# Patient Record
Sex: Male | Born: 1995 | Race: White | Hispanic: No | Marital: Single | Smoking: Never smoker
Health system: Southern US, Community
[De-identification: ages and names within clinical notes are randomized; demographics above are authoritative.]

---
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
## 2018-10-14 ENCOUNTER — Emergency Department: Payer: BLUE CROSS/BLUE SHIELD

## 2018-10-14 ENCOUNTER — Emergency Department
Admission: EM | Admit: 2018-10-14 | Discharge: 2018-10-14 | Disposition: A | Discharge status: Home / Self Care | Payer: BLUE CROSS/BLUE SHIELD | Attending: Emergency Medicine | Admitting: Emergency Medicine

## 2018-10-14 ENCOUNTER — Other Ambulatory Visit: Payer: Self-pay

## 2018-10-14 DIAGNOSIS — K529 Noninfective gastroenteritis and colitis, unspecified: Secondary | ICD-10-CM

## 2018-10-14 DIAGNOSIS — R112 Nausea with vomiting, unspecified: Secondary | ICD-10-CM

## 2018-10-14 DIAGNOSIS — R1031 Right lower quadrant pain: Secondary | ICD-10-CM | POA: Diagnosis present

## 2018-10-14 LAB — CBC
HCT: 43.4 % (ref 39.0–52.0)
Hemoglobin: 15.5 g/dL (ref 13.0–17.0)
MCH: 28.8 pg (ref 26.0–34.0)
MCHC: 35.7 g/dL (ref 30.0–36.0)
MCV: 80.5 fL (ref 80.0–100.0)
Platelets: 263 10*3/uL (ref 150–400)
RBC: 5.39 MIL/uL (ref 4.22–5.81)
RDW: 12.4 % (ref 11.5–15.5)
WBC: 7 10*3/uL (ref 4.0–10.5)
nRBC: 0 % (ref 0.0–0.2)

## 2018-10-14 LAB — COMPREHENSIVE METABOLIC PANEL
ALT: 15 U/L (ref 0–44)
AST: 18 U/L (ref 15–41)
Albumin: 4.9 g/dL (ref 3.5–5.0)
Alkaline Phosphatase: 62 U/L (ref 38–126)
Anion gap: 8 (ref 5–15)
BUN: 11 mg/dL (ref 6–20)
CO2: 27 mmol/L (ref 22–32)
Calcium: 9 mg/dL (ref 8.9–10.3)
Chloride: 102 mmol/L (ref 98–111)
Creatinine, Ser: 0.77 mg/dL (ref 0.61–1.24)
GFR calc Af Amer: >60 mL/min (ref >60)
GFR calc non Af Amer: >60 mL/min (ref >60)
Glucose, Bld: 102 mg/dL — ABNORMAL HIGH (ref 70–99)
Potassium: 3.8 mmol/L (ref 3.5–5.1)
Sodium: 137 mmol/L (ref 135–145)
Total Bilirubin: 1.3 mg/dL — ABNORMAL HIGH (ref 0.3–1.2)
Total Protein: 7.5 g/dL (ref 6.5–8.1)

## 2018-10-14 LAB — URINALYSIS, COMPLETE (UACMP) WITH MICROSCOPIC
Bacteria, UA: NONE SEEN
Bilirubin Urine: NEGATIVE
Glucose, UA: NEGATIVE mg/dL
Hgb urine dipstick: NEGATIVE
Ketones, ur: 5 mg/dL — AB
Leukocytes,Ua: NEGATIVE
Nitrite: NEGATIVE
Protein, ur: NEGATIVE mg/dL
Specific Gravity, Urine: 1.002 — ABNORMAL LOW (ref 1.005–1.030)
Squamous Epithelial / HPF: NONE SEEN (ref 0–5)
WBC, UA: NONE SEEN WBC/hpf (ref 0–5)
pH: 7 (ref 5.0–8.0)

## 2018-10-14 LAB — LIPASE, BLOOD: Lipase: 23 U/L (ref 11–51)

## 2018-10-14 MED ORDER — ONDANSETRON HCL 4 MG/2ML IJ SOLN
4.0000 mg | Freq: Once | INTRAMUSCULAR | Status: AC
  Administered 2018-10-14: 03:00:00 4 mg via INTRAVENOUS
  Filled 2018-10-14: qty 2

## 2018-10-14 MED ORDER — SODIUM CHLORIDE 0.9 % IV BOLUS
1000.0000 mL | Freq: Once | INTRAVENOUS | Status: AC
  Administered 2018-10-14: 1000 mL via INTRAVENOUS

## 2018-10-14 MED ORDER — IOHEXOL 300 MG/ML  SOLN
100.0000 mL | Freq: Once | INTRAMUSCULAR | Status: AC | PRN
  Administered 2018-10-14: 05:00:00 100 mL via INTRAVENOUS

## 2018-10-14 MED ORDER — KETOROLAC TROMETHAMINE 30 MG/ML IJ SOLN
15.0000 mg | Freq: Once | INTRAMUSCULAR | Status: AC
  Administered 2018-10-14: 15 mg via INTRAVENOUS
  Filled 2018-10-14: qty 1

## 2018-10-14 MED ORDER — ONDANSETRON 4 MG PO TBDP: 4.0000 mg | ORAL_TABLET | Freq: Three times a day (TID) | ORAL | 0 refills | Status: DC | PRN

## 2018-10-14 NOTE — ED Triage Notes (Signed)
Pt arrives to ED via POV from home with c/o RLQ abdominal pain x1 week with occasional radiation into the RUQ. Pt reports (+) vomiting and diarrhea. Pt reports 2 episodes of emesis in the last 24 hrs; 3-4 episodes of diarrhea. Pt states that the pain increases after eating, but decreases after "drinking lots of water". No c/o SHOB, no CP or fever. No previous abdominal surgeries. Pt is A&O, in NAD; RR even, regular, and unlabored.

## 2018-10-14 NOTE — Discharge Instructions (Addendum)

## 2018-10-14 NOTE — ED Provider Notes (Signed)
[facility] Emergency Department Provider Note  ____________________________________________  Time seen: Approximately 3:15 AM  I have reviewed the triage vital signs and the nursing notes.   HISTORY  Chief Complaint Abdominal Pain   HPI Peter Austin is a 23 y.o. male no significant past medical history who presents for evaluation of abdominal pain.  Patient is complaining of right lower quadrant sharp abdominal pain x1 week associated with vomiting and diarrhea.  The pain is sharp and intermittent.  Vomiting started today.  Had 2 episodes of nonbloody nonbilious emesis.  Has been having 2 daily episodes of watery diarrhea.  No fever or chills.  No known sick contact exposures.  No dysuria or hematuria, no testicular pain or swelling.  No cough, chest pain or shortness of breath.  The pain is currently moderate in intensity.   PMH None - reviewed  Allergies Patient has no known allergies.  No family history on file.  Social History Social History   Tobacco Use   Smoking status: Never Smoker   Smokeless tobacco: Never Used  Substance Use Topics   Alcohol use: Not on file   Drug use: Not on file    Review of Systems  Constitutional: Negative for fever. Eyes: Negative for visual changes. ENT: Negative for sore throat. Neck: No neck pain  Cardiovascular: Negative for chest pain. Respiratory: Negative for shortness of breath. Gastrointestinal: + abdominal pain, vomiting and diarrhea. Genitourinary: Negative for dysuria. Musculoskeletal: Negative for back pain. Skin: Negative for rash. Neurological: Negative for headaches, weakness or numbness. Psych: No SI or HI  ____________________________________________   PHYSICAL EXAM:  VITAL SIGNS: ED Triage Vitals  Enc Vitals Group     BP 10/14/18 0126 121/85     Pulse Rate 10/14/18 0126 93     Resp 10/14/18 0126 17     Temp 10/14/18 0126 98.7 F (37.1 C)     Temp Source 10/14/18 0126  Oral     SpO2 10/14/18 0126 100 %     Weight 10/14/18 0127 165 lb (74.8 kg)     Height 10/14/18 0127 5\' 7"  (1.702 m)     Head Circumference --      Peak Flow --      Pain Score 10/14/18 0126 3     Pain Loc --      Pain Edu? --      Excl. in GC? --     Constitutional: Alert and oriented. Well appearing and in no apparent distress. HEENT:      Head: Normocephalic and atraumatic.         Eyes: Conjunctivae are normal. Sclera is non-icteric.       Mouth/Throat: Mucous membranes are moist.       Neck: Supple with no signs of meningismus. Cardiovascular: Regular rate and rhythm. No murmurs, gallops, or rubs. 2+ symmetrical distal pulses are present in all extremities. No JVD. Respiratory: Normal respiratory effort. Lungs are clear to auscultation bilaterally. No wheezes, crackles, or rhonchi.  Gastrointestinal: Soft, tender to palpation in the RLQ, and non distended with positive bowel sounds. No rebound or guarding. Genitourinary: No CVA tenderness. No testicular pain or swelling Musculoskeletal: Nontender with normal range of motion in all extremities. No edema, cyanosis, or erythema of extremities. Neurologic: Normal speech and language. Face is symmetric. Moving all extremities. No gross focal neurologic deficits are appreciated. Skin: Skin is warm, dry and intact. No rash noted. Psychiatric: Mood and affect are normal. Speech and behavior are normal.  ____________________________________________   LABS (all labs ordered are listed, but only abnormal results are displayed)  Labs Reviewed  COMPREHENSIVE METABOLIC PANEL - Abnormal; Notable for the following components:      Result Value   Glucose, Bld 102 (*)    Total Bilirubin 1.3 (*)    All other components within normal limits  URINALYSIS, COMPLETE (UACMP) WITH MICROSCOPIC - Abnormal; Notable for the following components:   Color, Urine STRAW (*)    APPearance CLEAR (*)    Specific Gravity, Urine 1.002 (*)    Ketones, ur 5  (*)    All other components within normal limits  LIPASE, BLOOD  CBC   ____________________________________________  EKG  none  ____________________________________________  RADIOLOGY  I have personally reviewed the images performed during this visit and I agree with the Radiologist's read.   Interpretation by Radiologist:  Dg Abdomen 1 View  Result Date: 10/14/2018 CLINICAL DATA:  Right abdominal pain, vomiting, diarrhea EXAM: ABDOMEN - 1 VIEW COMPARISON:  None. FINDINGS: Nonobstructive bowel gas pattern. Suspected calcified pelvic phleboliths. Visualized osseous structures are within normal limits. IMPRESSION: Unremarkable abdominal radiograph. Electronically Signed   By: Charline BillsSriyesh  Krishnan M.D.   On: 10/14/2018 03:35   Ct Abdomen Pelvis W Contrast  Result Date: 10/14/2018 CLINICAL DATA:  Unspecified abdominal pain. Right lower quadrant pain for 1 week. Emesis and diarrhea EXAM: CT ABDOMEN AND PELVIS WITH CONTRAST TECHNIQUE: Multidetector CT imaging of the abdomen and pelvis was performed using the standard protocol following bolus administration of intravenous contrast. CONTRAST:  100mL OMNIPAQUE IOHEXOL 300 MG/ML  SOLN COMPARISON:  None. FINDINGS: Lower chest:  No contributory findings. Hepatobiliary: No focal liver abnormality.No evidence of biliary obstruction or stone. Pancreas: Unremarkable. Spleen: Unremarkable. Adrenals/Urinary Tract: Negative adrenals. No hydronephrosis or stone. Unremarkable bladder. Stomach/Bowel:  No obstruction. No appendicitis. Vascular/Lymphatic: No acute vascular abnormality. No mass or adenopathy. Reproductive:No pathologic findings. Other: Trace pelvic fluid, unexpected for demographics, from uncertain source. Musculoskeletal: No acute abnormalities. IMPRESSION: 1. Negative for appendicitis. 2. Trace pelvic fluid which is unexpected but nonspecific as an underlying source is not seen. Electronically Signed   By: Marnee SpringJonathon  Watts M.D.   On: 10/14/2018 05:34        ____________________________________________   PROCEDURES  Procedure(s) performed: None Procedures Critical Care performed:  None ____________________________________________   INITIAL IMPRESSION / ASSESSMENT AND PLAN / ED COURSE   23 y.o. male no significant past medical history who presents for evaluation of abdominal pain, vomiting, diarrhea x 7 days.  Patient is extremely well-appearing and in no distress, has normal vital signs, abdomen is soft with tenderness to palpation the right lower quadrant.  GU exam is normal.  Labs showing no leukocytosis, UA showing very small amount of ketones, CMP is within normal limits.  Differential diagnosis including viral gastroenteritis versus gastritis versus appendicitis versus epiploic appendagitis versus mesenteric adenitis.  At this time with 7 days of pain, no fever, no leukocytosis this is less likely to be appendicitis however obviously still in the differential diagnosis with tenderness to palpation on the right lower quadrant. Discussed with patient and father, getting CT versus waiting 12 hours with re-evaluation by PCP today. Patient ultimately opted for CT which is pending   _________________________ 5:49 AM on 10/14/2018 -----------------------------------------  CT negative for appendicitis.  There is a concerning with a viral gastroenteritis.  Will discharge home with Zofran, increase oral hydration, follow-up with PCP.  Discussed my standard return precautions.  As part of my medical decision making, I reviewed the  following data within the [place] notes reviewed and incorporated, Labs reviewed , Old chart reviewed, Radiograph reviewed , Notes from prior ED visits and [place] Controlled Substance Database   Patient was evaluated in Emergency Department today for the symptoms described in the history of present illness. Patient was evaluated in the context of the global COVID-19 pandemic, which  necessitated consideration that the patient might be at risk for infection with the SARS-CoV-2 virus that causes COVID-19. Institutional protocols and algorithms that pertain to the evaluation of patients at risk for COVID-19 are in a state of rapid change based on information released by regulatory bodies including the CDC and federal and state organizations. These policies and algorithms were followed during the patient's care in the ED.   ____________________________________________   FINAL CLINICAL IMPRESSION(S) / ED DIAGNOSES   Final diagnoses:  Nausea vomiting and diarrhea  Gastroenteritis      NEW MEDICATIONS STARTED DURING THIS VISIT:  ED Discharge Orders         Ordered    ondansetron (ZOFRAN ODT) 4 MG disintegrating tablet  Every 8 hours PRN     10/14/18 0548           Note:  This document was prepared using Dragon voice recognition software and may include unintentional dictation errors.    Rudene Re, MD 10/14/18 [phone]

## 2018-10-20 ENCOUNTER — Other Ambulatory Visit
Admission: RE | Admit: 2018-10-20 | Discharge: 2018-10-20 | Disposition: A | Discharge status: Home / Self Care | Payer: BLUE CROSS/BLUE SHIELD | Source: Ambulatory Visit | Attending: Gastroenterology | Admitting: Gastroenterology

## 2018-10-20 DIAGNOSIS — R197 Diarrhea, unspecified: Secondary | ICD-10-CM | POA: Insufficient documentation

## 2018-10-20 LAB — GASTROINTESTINAL PANEL BY PCR, STOOL (REPLACES STOOL CULTURE)

## 2018-10-20 LAB — C DIFFICILE QUICK SCREEN W PCR REFLEX
C Diff antigen: NEGATIVE
C Diff interpretation: NOT DETECTED
C Diff toxin: NEGATIVE

## 2018-10-29 LAB — CALPROTECTIN, FECAL: Calprotectin, Fecal: <16 ug/g (ref 0–120)

## 2018-11-18 ENCOUNTER — Other Ambulatory Visit: Payer: Self-pay | Admitting: Gastroenterology

## 2018-11-18 DIAGNOSIS — R1013 Epigastric pain: Secondary | ICD-10-CM

## 2018-11-25 ENCOUNTER — Other Ambulatory Visit: Payer: Self-pay

## 2018-11-25 ENCOUNTER — Ambulatory Visit
Admission: RE | Admit: 2018-11-25 | Discharge: 2018-11-25 | Disposition: A | Discharge status: Home / Self Care | Payer: BLUE CROSS/BLUE SHIELD | Source: Ambulatory Visit | Attending: Gastroenterology | Admitting: Gastroenterology

## 2018-11-25 DIAGNOSIS — R1013 Epigastric pain: Secondary | ICD-10-CM | POA: Diagnosis not present

## 2020-08-12 IMAGING — CT CT ABDOMEN AND PELVIS WITH CONTRAST
2 of 4 series · 16 of 46 positions shown, 18 images · IV contrast (APPLIED)
Comparison: None.

CLINICAL DATA: Unspecified abdominal pain. Right lower quadrant
pain for 1 week. Emesis and diarrhea

EXAM:
CT ABDOMEN AND PELVIS WITH CONTRAST
TECHNIQUE: Multidetector CT imaging of the abdomen and pelvis was performed
using the standard protocol following bolus administration of
intravenous contrast.
CONTRAST:  100mL OMNIPAQUE IOHEXOL 300 MG/ML  SOLN

[Series 2: axial st · axial · 0.73mm/px · z∈[-882,-447]mm · 13 of 97 slices shown, 15 images]
[im 5/97  soft-tissue]
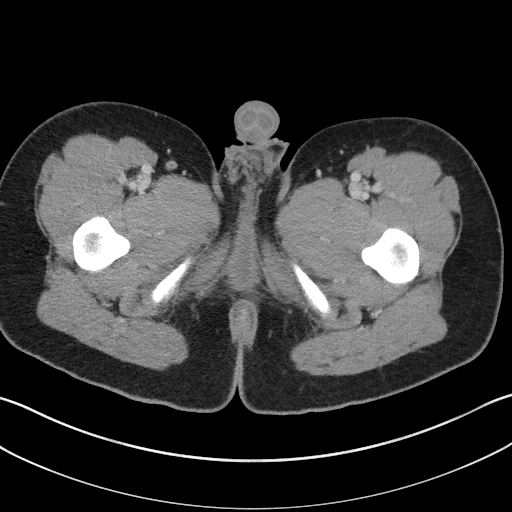
[im 5/97  bone]
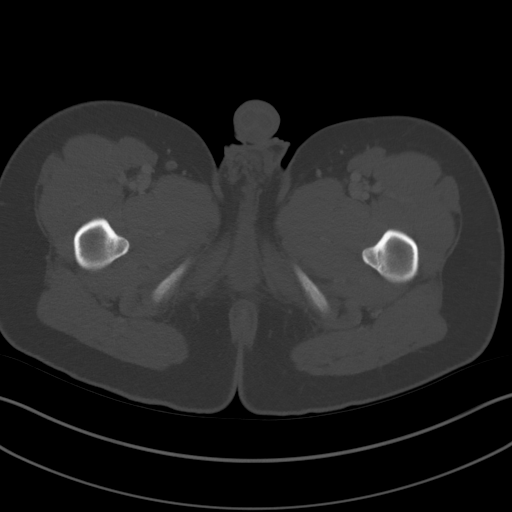
[im 13/97  soft-tissue]
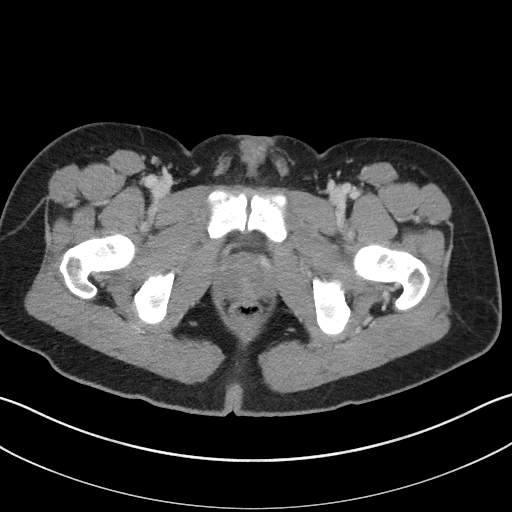
[im 21/97  soft-tissue]
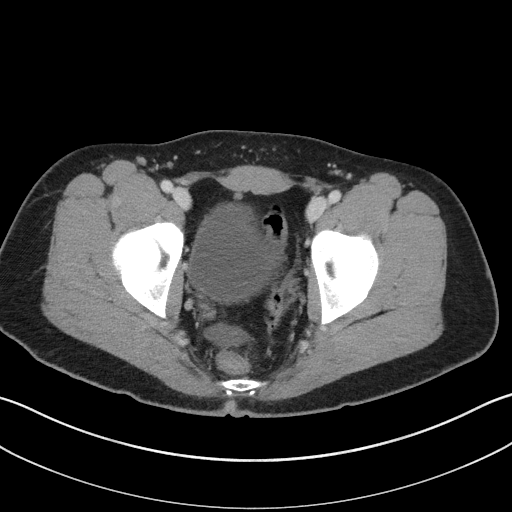
[im 26/97  soft-tissue]
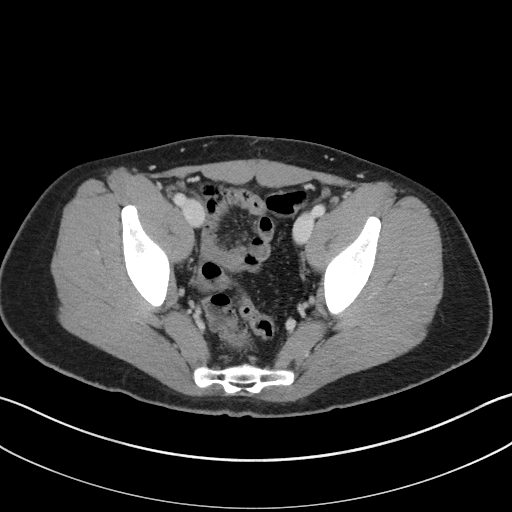
[im 34/97  soft-tissue]
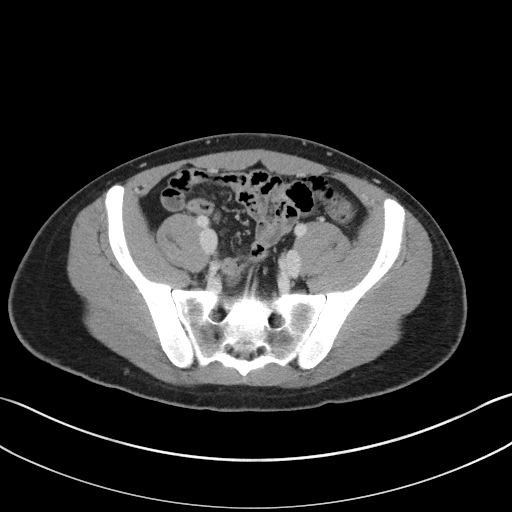
[im 42/97  soft-tissue]
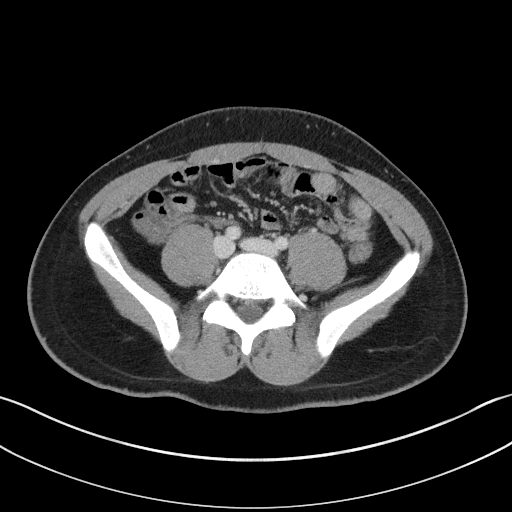
[im 51/97  soft-tissue]
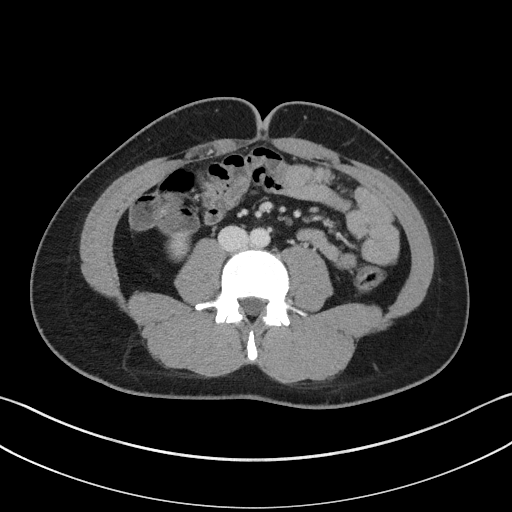
[im 55/97  soft-tissue]
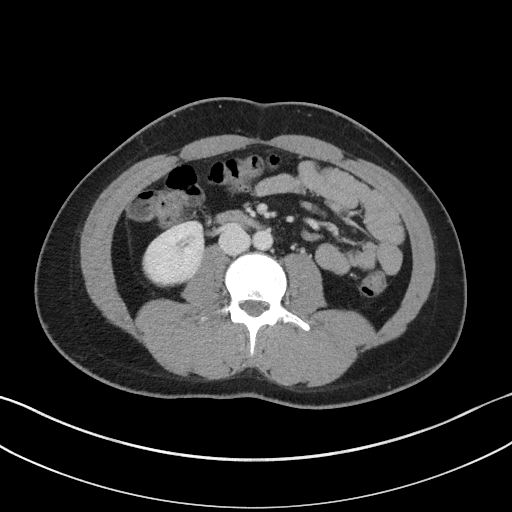
[im 63/97  soft-tissue]
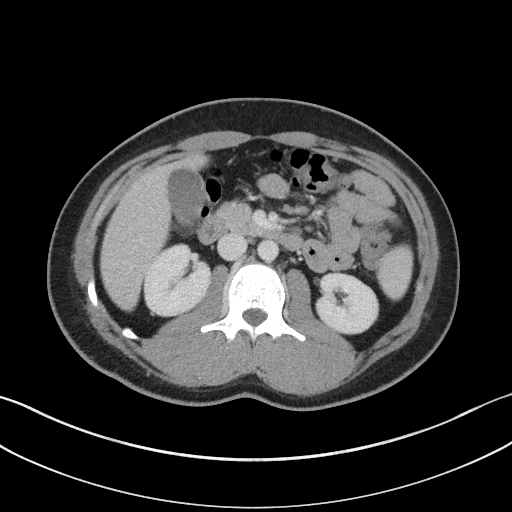
[im 63/97  bone]
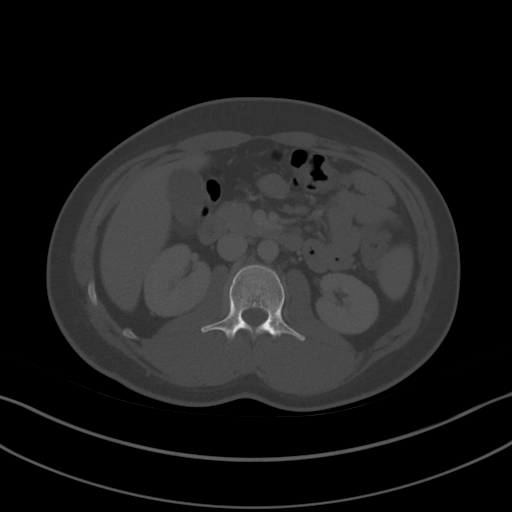
[im 71/97  soft-tissue]
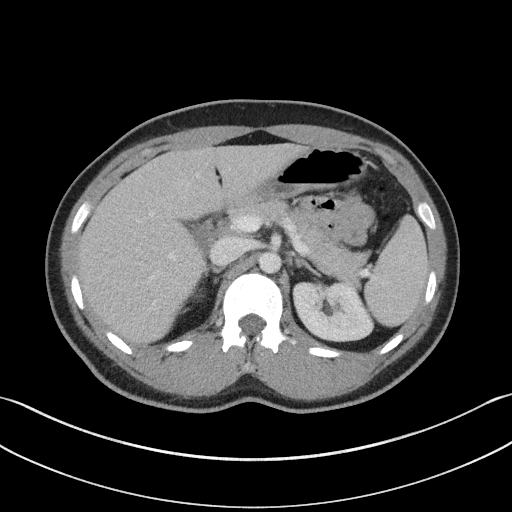
[im 76/97  soft-tissue]
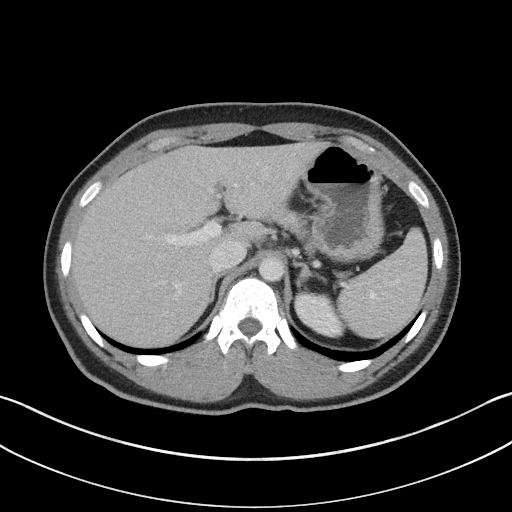
[im 84/97  soft-tissue]
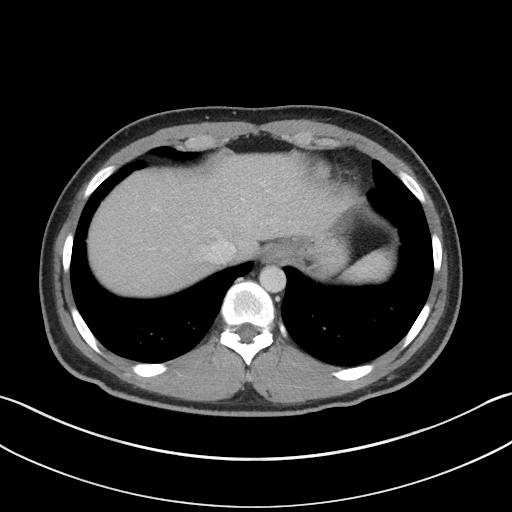
[im 92/97  soft-tissue]
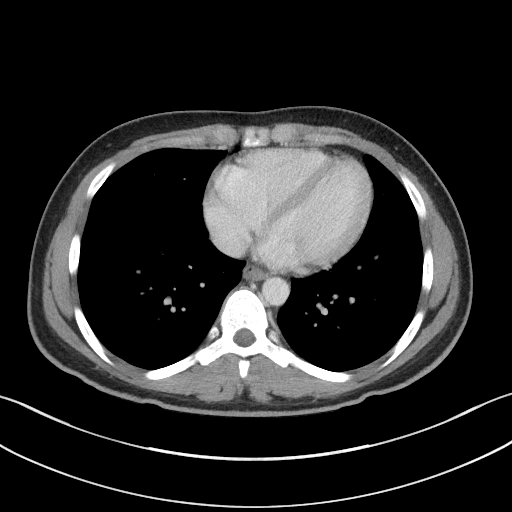

[Series 5: coronal st · coronal · 0.68mm/px · 3 of 100 slices shown]
[im 34/100  soft-tissue]
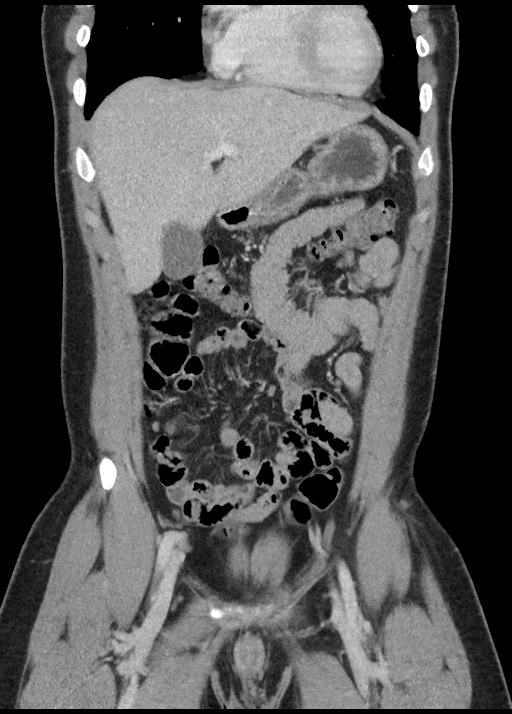
[im 45/100  soft-tissue]
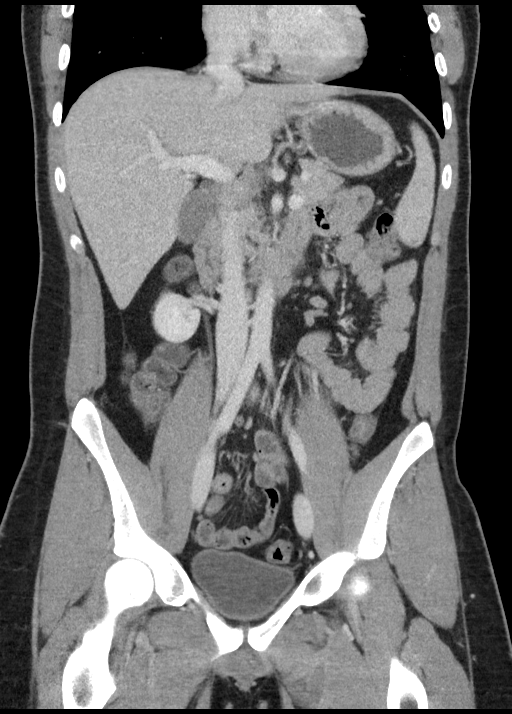
[im 56/100  soft-tissue]
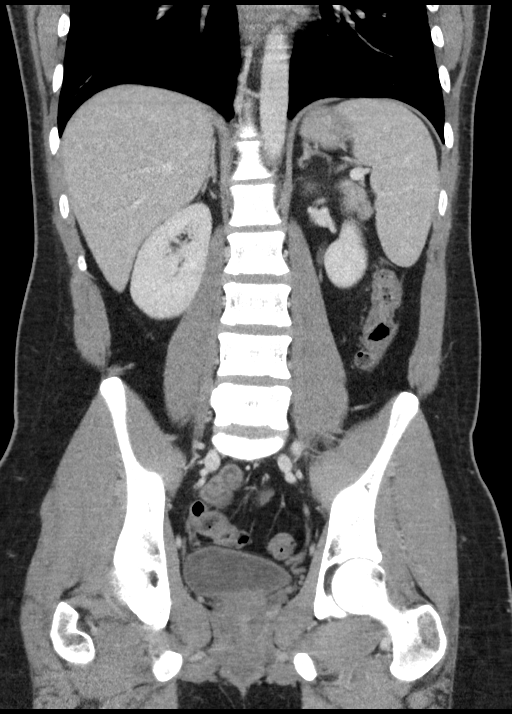

[16 of 46 positions shown; findings below may reference images not displayed]

FINDINGS: Lower chest:  No contributory findings.

Hepatobiliary: No focal liver abnormality.No evidence of biliary
obstruction or stone.

Pancreas: Unremarkable.

Spleen: Unremarkable.

Adrenals/Urinary Tract: Negative adrenals. No hydronephrosis or
stone. Unremarkable bladder.

Stomach/Bowel:  No obstruction. No appendicitis.

Vascular/Lymphatic: No acute vascular abnormality. No mass or
adenopathy.

Reproductive:No pathologic findings.

Other: Trace pelvic fluid, unexpected for demographics, from
uncertain source.

Musculoskeletal: No acute abnormalities.
IMPRESSION: 1. Negative for appendicitis.
2. Trace pelvic fluid which is unexpected but nonspecific as an
underlying source is not seen.

## 2021-07-03 ENCOUNTER — Ambulatory Visit: Payer: BC Managed Care – PPO | Admitting: Podiatry

## 2021-07-03 DIAGNOSIS — B070 Plantar wart: Secondary | ICD-10-CM | POA: Diagnosis not present

## 2021-07-09 NOTE — Progress Notes (Signed)
?  Subjective:  ?Patient ID: Peter Austin, male    DOB: 09/26/1995,  MRN: [id number] ? ?Chief Complaint  ?Patient presents with  ? Plantar Warts  ?  Bilateral plantar warts   ? ? ?26 y.o. male presents with the above complaint.  Patient presents with bilateral foot plantar verruca right heel and left forefoot.  Patient states he had this for a little over a year has progressed gotten worse and spread all over the place.  He has not seen anyone else prior to seeing me.  He has tried over-the-counter options none of which has helped.  He would like to discuss treatment options for this. ? ? ?Review of Systems: Negative except as noted in the HPI. Denies N/V/F/Ch. ? ?No past medical history on file. ? ?Current Outpatient Medications:  ?  ondansetron (ZOFRAN ODT) 4 MG disintegrating tablet, Take 1 tablet (4 mg total) by mouth every 8 (eight) hours as needed., Disp: 20 tablet, Rfl: 0 ? ?Social History  ? ?Tobacco Use  ?Smoking Status Never  ?Smokeless Tobacco Never  ? ? ?No Known Allergies ?Objective:  ?There were no vitals filed for this visit. ?There is no height or weight on file to calculate BMI. ?Constitutional Well developed. ?Well nourished.  ?Vascular Dorsalis pedis pulses palpable bilaterally. ?Posterior tibial pulses palpable bilaterally. ?Capillary refill normal to all digits.  ?No cyanosis or clubbing noted. ?Pedal hair growth normal.  ?Neurologic Normal speech. ?Oriented to person, place, and time. ?Epicritic sensation to light touch grossly present bilaterally.  ?Dermatologic Right heel plantar verruca with pinpoint bleeding upon debridement noted left forefoot plantar verruca.  Multiple sites noted.  Pain on palpation to these lesions  ?Orthopedic: Normal joint ROM without pain or crepitus bilaterally. ?No visible deformities. ?No bony tenderness.  ? ?Radiographs: None ?Assessment:  ? ?1. Plantar verruca   ? ?Plan:  ?Patient was evaluated and treated and all questions answered. ? ?Bilateral foot plantar  verruca/benign skin lesion ?-All questions and concerns were discussed with the patient in extensive detail.  Given the sheer volume of the lesions that are present I believe patient would benefit from laser therapy.  We will hold off on catheter therapy for now.  I discussed this with the patient he states understanding ?-He will be scheduled for laser ? ?Return in about 1 week (around 07/10/2021). ?

## 2021-07-26 ENCOUNTER — Other Ambulatory Visit: Payer: BC Managed Care – PPO

## 2021-09-20 ENCOUNTER — Ambulatory Visit: Payer: BC Managed Care – PPO | Admitting: Podiatry

## 2021-09-20 DIAGNOSIS — B070 Plantar wart: Secondary | ICD-10-CM

## 2021-09-20 NOTE — Progress Notes (Signed)
Patient presents today for laser treatment for plantar warts on the right and left foot. There are 5 lesions on left foot and 4 lesions on right foot.   Dr. Allena Katz patient.  All other systems are negative.  Lesions were debrided superficially. Laser therapy was administered to the left and right foot. The patient tolerated the treatment well. All safety precautions were in place.   Follow up in 2 weeks for laser # 2.

## 2021-10-03 ENCOUNTER — Ambulatory Visit: Payer: BC Managed Care – PPO

## 2021-10-03 DIAGNOSIS — B070 Plantar wart: Secondary | ICD-10-CM | POA: Diagnosis not present

## 2021-10-03 NOTE — Progress Notes (Signed)
Patient presents today for laser treatment for plantar warts on the right and left foot. There are 5 lesions on left foot and 4 lesions on right foot.   Dr. Allena Katz patient.  All other systems are negative.  Lesions were debrided superficially. Laser therapy was administered to the left and right foot. The patient tolerated the treatment well. All safety precautions were in place.   Follow up in 2 weeks for laser # 3.

## 2021-10-31 ENCOUNTER — Ambulatory Visit: Payer: BC Managed Care – PPO | Admitting: Podiatry

## 2021-10-31 DIAGNOSIS — B070 Plantar wart: Secondary | ICD-10-CM

## 2021-10-31 NOTE — Progress Notes (Signed)
Patient presents today for laser treatment for plantar warts on the right and left foot. There are 5 lesions on left foot and 4 lesions on right foot.    Dr. Allena Katz patient.   All other systems are negative.   Lesions were debrided superficially. Laser therapy was administered to the left and right foot. The patient tolerated the treatment well. All safety precautions were in place.    Follow up in 2 weeks for laser # 4.

## 2021-11-20 ENCOUNTER — Ambulatory Visit: Payer: BC Managed Care – PPO | Admitting: Podiatry

## 2021-11-20 DIAGNOSIS — B070 Plantar wart: Secondary | ICD-10-CM

## 2021-11-20 NOTE — Progress Notes (Signed)
Patient presents today for laser treatment for plantar warts on the right and left foot. There are 5 lesions on left foot and 4 lesions on right foot.    Dr. Allena Katz patient.   All other systems are negative.   Lesions were debrided superficially. Laser therapy was administered to the left and right foot. The patient tolerated the treatment well. All safety precautions were in place.    Follow up in 2 weeks with Dr. Allena Katz.

## 2021-11-22 ENCOUNTER — Other Ambulatory Visit: Payer: BC Managed Care – PPO

## 2021-12-12 ENCOUNTER — Ambulatory Visit: Payer: BC Managed Care – PPO | Admitting: Podiatry

## 2021-12-19 ENCOUNTER — Ambulatory Visit: Payer: BC Managed Care – PPO | Admitting: Podiatry

## 2021-12-19 ENCOUNTER — Encounter: Payer: Self-pay | Admitting: Podiatry

## 2021-12-19 DIAGNOSIS — B070 Plantar wart: Secondary | ICD-10-CM

## 2021-12-24 NOTE — Progress Notes (Signed)
  Subjective:  Patient ID: Peter Austin, male    DOB: [birth year],  MRN: [id number]  Chief Complaint  Patient presents with   Plantar Warts    26 y.o. male presents with the above complaint.  Presents for follow-up of bilateral plantar verruca.  He states the laser therapy seems to do okay has not gotten rid of his warts.  He wanted to discuss other treatment plans including Cantharone  Review of Systems: Negative except as noted in the HPI. Denies N/V/F/Ch.  History reviewed. No pertinent past medical history. No current outpatient medications on file.  Social History   Tobacco Use  Smoking Status Never  Smokeless Tobacco Never    No Known Allergies Objective:  There were no vitals filed for this visit. There is no height or weight on file to calculate BMI. Constitutional Well developed. Well nourished.  Vascular Dorsalis pedis pulses palpable bilaterally. Posterior tibial pulses palpable bilaterally. Capillary refill normal to all digits.  No cyanosis or clubbing noted. Pedal hair growth normal.  Neurologic Normal speech. Oriented to person, place, and time. Epicritic sensation to light touch grossly present bilaterally.  Dermatologic Right heel plantar verruca with pinpoint bleeding upon debridement noted left forefoot plantar verruca.  Multiple sites noted.  Pain on palpation to these lesions  Orthopedic: Normal joint ROM without pain or crepitus bilaterally. No visible deformities. No bony tenderness.   Radiographs: None Assessment:   No diagnosis found.  Plan:  Patient was evaluated and treated and all questions answered.  Bilateral foot plantar verruca/benign skin lesion -All questions and concerns were discussed with the patient in extensive detail.  Patient will benefit from resuming Cantharone therapy as he has failed laser therapy.  Patient agrees with plan like to do Cantharone therapy. --Lesion was debrided today without complications. Hemostasis was  achieved and the area was cleaned. Cantharone was applied followed by an occlusive bandage. Post procedure complications were discussed. Monitor for signs or symptoms of infection and directed to call the office mainly should any occur.   No follow-ups on file.

## 2022-01-02 ENCOUNTER — Encounter: Payer: Self-pay | Admitting: Podiatry

## 2022-01-02 ENCOUNTER — Ambulatory Visit: Payer: BC Managed Care – PPO | Admitting: Podiatry

## 2022-01-02 DIAGNOSIS — B070 Plantar wart: Secondary | ICD-10-CM

## 2022-01-02 NOTE — Progress Notes (Signed)
  Subjective:  Patient ID: Peter Austin, male    DOB: 1995-07-14,  MRN: [id number]  Chief Complaint  Patient presents with   Plantar Warts    25 y.o. male presents with the above complaint.  Patient presents with bilateral plantar verruca.  He states that Cantharone therapy helped he had a lot of pain but is doing much better.  Review of Systems: Negative except as noted in the HPI. Denies N/V/F/Ch.  History reviewed. No pertinent past medical history. No current outpatient medications on file.  Social History   Tobacco Use  Smoking Status Never  Smokeless Tobacco Never    No Known Allergies Objective:  There were no vitals filed for this visit. There is no height or weight on file to calculate BMI. Constitutional Well developed. Well nourished.  Vascular Dorsalis pedis pulses palpable bilaterally. Posterior tibial pulses palpable bilaterally. Capillary refill normal to all digits.  No cyanosis or clubbing noted. Pedal hair growth normal.  Neurologic Normal speech. Oriented to person, place, and time. Epicritic sensation to light touch grossly present bilaterally.  Dermatologic Right heel plantar verruca with pinpoint bleeding upon debridement noted left forefoot plantar verruca.  Multiple sites noted.  Pain on palpation to these lesions  Orthopedic: Normal joint ROM without pain or crepitus bilaterally. No visible deformities. No bony tenderness.   Radiographs: None Assessment:   No diagnosis found.  Plan:  Patient was evaluated and treated and all questions answered.  Bilateral foot plantar verruca/benign skin lesion~second application -All questions and concerns were discussed with the patient in extensive detail.  Patient will benefit from resuming Cantharone therapy as he has failed laser therapy.  Patient agrees with plan like to do Cantharone therapy. --Lesion was debrided today without complications. Hemostasis was achieved and the area was cleaned.  Cantharone was applied followed by an occlusive bandage. Post procedure complications were discussed. Monitor for signs or symptoms of infection and directed to call the office mainly should any occur.   No follow-ups on file.

## 2022-01-16 ENCOUNTER — Encounter: Payer: Self-pay | Admitting: Podiatry

## 2022-01-16 ENCOUNTER — Ambulatory Visit: Payer: BC Managed Care – PPO | Admitting: Podiatry

## 2022-01-16 DIAGNOSIS — B070 Plantar wart: Secondary | ICD-10-CM

## 2022-01-16 MED ORDER — FLUOROURACIL 5 % EX CREA: TOPICAL_CREAM | Freq: Two times a day (BID) | CUTANEOUS | 0 refills | Status: AC

## 2022-01-16 NOTE — Progress Notes (Signed)
  Subjective:  Patient ID: Peter Austin, male    DOB: Feb 26, 1996,  MRN: [id number]  Chief Complaint  Patient presents with   Plantar Warts    "I can't tell a difference, a lot of dead skin."    26 y.o. male presents with the above complaint.  Patient presents with bilateral plantar verruca.  He states that Cantharone therapy helped he had a lot of pain but is doing much better.  Review of Systems: Negative except as noted in the HPI. Denies N/V/F/Ch.  No past medical history on file. No current outpatient medications on file.  Social History   Tobacco Use  Smoking Status Never  Smokeless Tobacco Never    No Known Allergies Objective:  There were no vitals filed for this visit. There is no height or weight on file to calculate BMI. Constitutional Well developed. Well nourished.  Vascular Dorsalis pedis pulses palpable bilaterally. Posterior tibial pulses palpable bilaterally. Capillary refill normal to all digits.  No cyanosis or clubbing noted. Pedal hair growth normal.  Neurologic Normal speech. Oriented to person, place, and time. Epicritic sensation to light touch grossly present bilaterally.  Dermatologic Right heel plantar verruca with pinpoint bleeding upon debridement noted left forefoot plantar verruca.  Multiple sites noted.  Pain on palpation to these lesions  Orthopedic: Normal joint ROM without pain or crepitus bilaterally. No visible deformities. No bony tenderness.   Radiographs: None Assessment:   No diagnosis found.  Plan:  Patient was evaluated and treated and all questions answered.  Bilateral foot plantar verruca/benign skin lesion~another application -All questions and concerns were discussed with the patient in extensive detail.  Patient will benefit from resuming Cantharone therapy as he has failed laser therapy.  Patient agrees with plan like to do Cantharone therapy. --Lesion was debrided today without complications. Hemostasis was  achieved and the area was cleaned. Cantharone was applied followed by an occlusive bandage. Post procedure complications were discussed. Monitor for signs or symptoms of infection and directed to call the office mainly should any occur.   No follow-ups on file.

## 2022-01-30 ENCOUNTER — Ambulatory Visit: Payer: BC Managed Care – PPO | Admitting: Podiatry

## 2022-01-30 DIAGNOSIS — B070 Plantar wart: Secondary | ICD-10-CM

## 2022-02-05 NOTE — Progress Notes (Signed)
  Subjective:  Patient ID: Peter Austin, male    DOB: 1995-07-04,  MRN: [id number]  Chief Complaint  Patient presents with   Plantar Warts    26 y.o. male presents with the above complaint.  Patient presents with bilateral plantar verruca.  He has been able to tolerate the Cantharone therapy.  He is here for another application  Review of Systems: Negative except as noted in the HPI. Denies N/V/F/Ch.  No past medical history on file.  Current Outpatient Medications:    fluorouracil (EFUDEX) 5 % cream, Apply topically 2 (two) times daily., Disp: 40 g, Rfl: 0  Social History   Tobacco Use  Smoking Status Never  Smokeless Tobacco Never    No Known Allergies Objective:  There were no vitals filed for this visit. There is no height or weight on file to calculate BMI. Constitutional Well developed. Well nourished.  Vascular Dorsalis pedis pulses palpable bilaterally. Posterior tibial pulses palpable bilaterally. Capillary refill normal to all digits.  No cyanosis or clubbing noted. Pedal hair growth normal.  Neurologic Normal speech. Oriented to person, place, and time. Epicritic sensation to light touch grossly present bilaterally.  Dermatologic Right heel plantar verruca with pinpoint bleeding upon debridement noted left forefoot plantar verruca.  Multiple sites noted.  Pain on palpation to these lesions  Orthopedic: Normal joint ROM without pain or crepitus bilaterally. No visible deformities. No bony tenderness.   Radiographs: None Assessment:   No diagnosis found.  Plan:  Patient was evaluated and treated and all questions answered.  Bilateral foot plantar verruca/benign skin lesion~another application -All questions and concerns were discussed with the patient in extensive detail.  Patient will benefit from resuming Cantharone therapy as he has failed laser therapy.  Patient agrees with plan like to do Cantharone therapy. --Lesion was debrided today without  complications. Hemostasis was achieved and the area was cleaned. Cantharone was applied followed by an occlusive bandage. Post procedure complications were discussed. Monitor for signs or symptoms of infection and directed to call the office mainly should any occur.   No follow-ups on file.

## 2022-02-18 ENCOUNTER — Ambulatory Visit: Payer: BC Managed Care – PPO | Admitting: Podiatry

## 2022-03-06 ENCOUNTER — Ambulatory Visit: Payer: BC Managed Care – PPO | Admitting: Podiatry

## 2022-03-06 DIAGNOSIS — B070 Plantar wart: Secondary | ICD-10-CM

## 2022-03-06 MED ORDER — CIMETIDINE 200 MG PO TABS: 200.0000 mg | ORAL_TABLET | Freq: Two times a day (BID) | ORAL | 0 refills | Status: AC

## 2022-03-13 NOTE — Progress Notes (Signed)
  Subjective:  Patient ID: Peter Austin, male    DOB: [birth year],  MRN: [id number]  Chief Complaint  Patient presents with   Plantar Warts    26 y.o. male presents with the above complaint.  Patient presents with bilateral plantar verruca.  He has been able to tolerate the Cantharone therapy.  He is here for another application  Review of Systems: Negative except as noted in the HPI. Denies N/V/F/Ch.  No past medical history on file.  Current Outpatient Medications:    cimetidine (TAGAMET HB) 200 MG tablet, Take 1 tablet (200 mg total) by mouth 2 (two) times daily., Disp: 60 tablet, Rfl: 0   fluorouracil (EFUDEX) 5 % cream, Apply topically 2 (two) times daily., Disp: 40 g, Rfl: 0  Social History   Tobacco Use  Smoking Status Never  Smokeless Tobacco Never    No Known Allergies Objective:  There were no vitals filed for this visit. There is no height or weight on file to calculate BMI. Constitutional Well developed. Well nourished.  Vascular Dorsalis pedis pulses palpable bilaterally. Posterior tibial pulses palpable bilaterally. Capillary refill normal to all digits.  No cyanosis or clubbing noted. Pedal hair growth normal.  Neurologic Normal speech. Oriented to person, place, and time. Epicritic sensation to light touch grossly present bilaterally.  Dermatologic Right heel plantar verruca with pinpoint bleeding upon debridement noted left forefoot plantar verruca.  Multiple sites noted.  Pain on palpation to these lesions  Orthopedic: Normal joint ROM without pain or crepitus bilaterally. No visible deformities. No bony tenderness.   Radiographs: None Assessment:   No diagnosis found.  Plan:  Patient was evaluated and treated and all questions answered.  Bilateral foot plantar verruca/benign skin lesion -All questions and concerns were discussed with the patient in extensive detail.   -We will hold off on any further Cantharone therapy.  Patient has  dermatology office appointment that is scheduled for freezing.  We will try this avenue for treatment options see if it helps. -If there is no improvement he will come back and see me.  No follow-ups on file.
